# Patient Record
Sex: Male | Born: 2000 | Race: Black or African American | Hispanic: No | Marital: Single | Smoking: Current every day smoker
Health system: Southern US, Community
[De-identification: ages and names within clinical notes are randomized; demographics above are authoritative.]

## PROBLEM LIST (undated history)

## (undated) HISTORY — PX: TYMPANOSTOMY TUBE PLACEMENT: SHX32

---
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
## 2021-03-09 ENCOUNTER — Emergency Department (HOSPITAL_BASED_OUTPATIENT_CLINIC_OR_DEPARTMENT_OTHER)
Admission: EM | Admit: 2021-03-09 | Discharge: 2021-03-10 | Disposition: A | Discharge status: Home / Self Care | Payer: Medicaid Other | Attending: Emergency Medicine | Admitting: Emergency Medicine

## 2021-03-09 ENCOUNTER — Other Ambulatory Visit: Payer: Self-pay

## 2021-03-09 ENCOUNTER — Encounter (HOSPITAL_BASED_OUTPATIENT_CLINIC_OR_DEPARTMENT_OTHER): Payer: Self-pay

## 2021-03-09 ENCOUNTER — Emergency Department (HOSPITAL_COMMUNITY): Payer: Medicaid Other

## 2021-03-09 ENCOUNTER — Emergency Department (HOSPITAL_BASED_OUTPATIENT_CLINIC_OR_DEPARTMENT_OTHER): Payer: Medicaid Other

## 2021-03-09 DIAGNOSIS — R900 Intracranial space-occupying lesion found on diagnostic imaging of central nervous system: Secondary | ICD-10-CM | POA: Insufficient documentation

## 2021-03-09 DIAGNOSIS — R0789 Other chest pain: Secondary | ICD-10-CM

## 2021-03-09 DIAGNOSIS — Y92410 Unspecified street and highway as the place of occurrence of the external cause: Secondary | ICD-10-CM | POA: Insufficient documentation

## 2021-03-09 DIAGNOSIS — F17210 Nicotine dependence, cigarettes, uncomplicated: Secondary | ICD-10-CM | POA: Diagnosis not present

## 2021-03-09 DIAGNOSIS — S199XXA Unspecified injury of neck, initial encounter: Secondary | ICD-10-CM | POA: Diagnosis present

## 2021-03-09 DIAGNOSIS — S161XXA Strain of muscle, fascia and tendon at neck level, initial encounter: Secondary | ICD-10-CM | POA: Diagnosis not present

## 2021-03-09 DIAGNOSIS — G939 Disorder of brain, unspecified: Secondary | ICD-10-CM

## 2021-03-09 NOTE — ED Notes (Signed)
Pt in MRI; to be brought back to [facility]
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
## 2021-03-09 NOTE — ED Provider Notes (Signed)
I attempted to contact the patient with the number (for him and his mother) provided as it does not appear they checked into [facility] ED yet.  They were instructed to leave and have his mother drive him directly to the [facility] ED in [place], with address and phone number provided to them. The phone went to voicemail with no voicemail box provided to leave message.   Terald Sleeper, MD 03/09/21 [phone]
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
## 2021-03-10 MED ORDER — IBUPROFEN 800 MG PO TABS: 800.0000 mg | ORAL_TABLET | Freq: Three times a day (TID) | ORAL | 0 refills | Status: AC

## 2021-03-10 MED ORDER — CYCLOBENZAPRINE HCL 10 MG PO TABS: 10.0000 mg | ORAL_TABLET | Freq: Two times a day (BID) | ORAL | 0 refills | Status: AC | PRN
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
## 2021-04-10 ENCOUNTER — Encounter (HOSPITAL_COMMUNITY): Payer: Self-pay | Admitting: Radiology
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
